# Patient Record
Sex: Female | Born: 1990 | Race: White | Hispanic: Refuse to answer | Smoking: Never smoker
Health system: Southern US, Community
[De-identification: ages and names within clinical notes are randomized; demographics above are authoritative.]

---

## 2018-03-16 ENCOUNTER — Other Ambulatory Visit
Admission: RE | Admit: 2018-03-16 | Discharge: 2018-03-16 | Disposition: A | Discharge status: Home / Self Care | Payer: BLUE CROSS/BLUE SHIELD | Source: Ambulatory Visit | Attending: Student | Admitting: Student

## 2018-03-16 DIAGNOSIS — R197 Diarrhea, unspecified: Secondary | ICD-10-CM | POA: Diagnosis present

## 2018-03-16 LAB — C DIFFICILE QUICK SCREEN W PCR REFLEX
C DIFFICILE (CDIFF) INTERP: NOT DETECTED
C Diff antigen: NEGATIVE
C Diff toxin: NEGATIVE

## 2018-03-16 LAB — GASTROINTESTINAL PANEL BY PCR, STOOL (REPLACES STOOL CULTURE)
Adenovirus F40/41: NOT DETECTED
Astrovirus: NOT DETECTED
Campylobacter species: NOT DETECTED
Cryptosporidium: NOT DETECTED
Cyclospora cayetanensis: NOT DETECTED
Entamoeba histolytica: NOT DETECTED
Enteroaggregative E coli (EAEC): NOT DETECTED
Enteropathogenic E coli (EPEC): NOT DETECTED
Enterotoxigenic E coli (ETEC): NOT DETECTED
Giardia lamblia: NOT DETECTED
Norovirus GI/GII: NOT DETECTED
Plesimonas shigelloides: NOT DETECTED
Rotavirus A: NOT DETECTED
SHIGA LIKE TOXIN PRODUCING E COLI (STEC): NOT DETECTED
Salmonella species: NOT DETECTED
Sapovirus (I, II, IV, and V): NOT DETECTED
Shigella/Enteroinvasive E coli (EIEC): NOT DETECTED
Vibrio cholerae: NOT DETECTED
Vibrio species: NOT DETECTED
Yersinia enterocolitica: NOT DETECTED

## 2018-03-19 LAB — CALPROTECTIN, FECAL: Calprotectin, Fecal: 26 ug/g (ref 0–120)

## 2019-06-21 ENCOUNTER — Other Ambulatory Visit: Payer: Self-pay

## 2019-06-21 ENCOUNTER — Ambulatory Visit (LOCAL_COMMUNITY_HEALTH_CENTER): Payer: Self-pay

## 2019-06-21 ENCOUNTER — Telehealth: Payer: Self-pay | Admitting: Family Medicine

## 2019-06-21 ENCOUNTER — Ambulatory Visit
Admission: RE | Admit: 2019-06-21 | Discharge: 2019-06-21 | Disposition: A | Discharge status: Home / Self Care | Payer: BC Managed Care – PPO | Source: Ambulatory Visit | Attending: Family Medicine | Admitting: Family Medicine

## 2019-06-21 ENCOUNTER — Ambulatory Visit
Admission: RE | Admit: 2019-06-21 | Discharge: 2019-06-21 | Disposition: A | Discharge status: Home / Self Care | Payer: BC Managed Care – PPO | Attending: Family Medicine | Admitting: Family Medicine

## 2019-06-21 VITALS — Wt 113.0 lb

## 2019-06-21 DIAGNOSIS — R7611 Nonspecific reaction to tuberculin skin test without active tuberculosis: Secondary | ICD-10-CM | POA: Insufficient documentation

## 2019-06-21 NOTE — Telephone Encounter (Signed)
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

## 2019-06-21 NOTE — Telephone Encounter (Signed)
TC with patient.  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  PPD read yesterday.  States [place] and thinks she may have had a +PPD in the past.  Appt scheduled for TB EPI and PPDR [name], RN

## 2019-06-21 NOTE — Progress Notes (Signed)
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  Discussed LTBI vs Active TB, tx options and employer letter for work.  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 TB RN will call patient with CXR results and will provide employer letter. Gave literature re: TB and Rifampin. [name], RN

## 2019-06-25 ENCOUNTER — Ambulatory Visit (LOCAL_COMMUNITY_HEALTH_CENTER): Payer: Self-pay

## 2019-06-25 VITALS — Wt 113.0 lb

## 2019-06-25 DIAGNOSIS — R7611 Nonspecific reaction to tuberculin skin test without active tuberculosis: Secondary | ICD-10-CM

## 2019-06-25 DIAGNOSIS — Z227 Latent tuberculosis: Secondary | ICD-10-CM

## 2019-06-25 MED ORDER — RIFAMPIN 300 MG PO CAPS: 600.0000 mg | ORAL_CAPSULE | Freq: Every day | ORAL | 0 refills | Status: AC

## 2019-06-25 NOTE — Progress Notes (Signed)
Rifampin 300mg  #60 dispensed per Dr. S.O. TB RN will f/u with patient in 2-3 weeks to schedule next Rifampin appt. Patient has TB RN contact info in case of questions or concerns.   Employer letter given to patient this am Alvester Morin, RN

## 2019-07-24 ENCOUNTER — Telehealth: Payer: Self-pay

## 2019-09-17 NOTE — Telephone Encounter (Signed)
TB closure letter sent. See scanned [name], RN

## 2021-02-19 IMAGING — CR DG CHEST 1V
1 series · 1 of 1 positions shown · non-contrast
Comparison: None.

CLINICAL DATA: Positive tuberculin skin test

EXAM:
CHEST  1 VIEW

[dg chest 1 view]
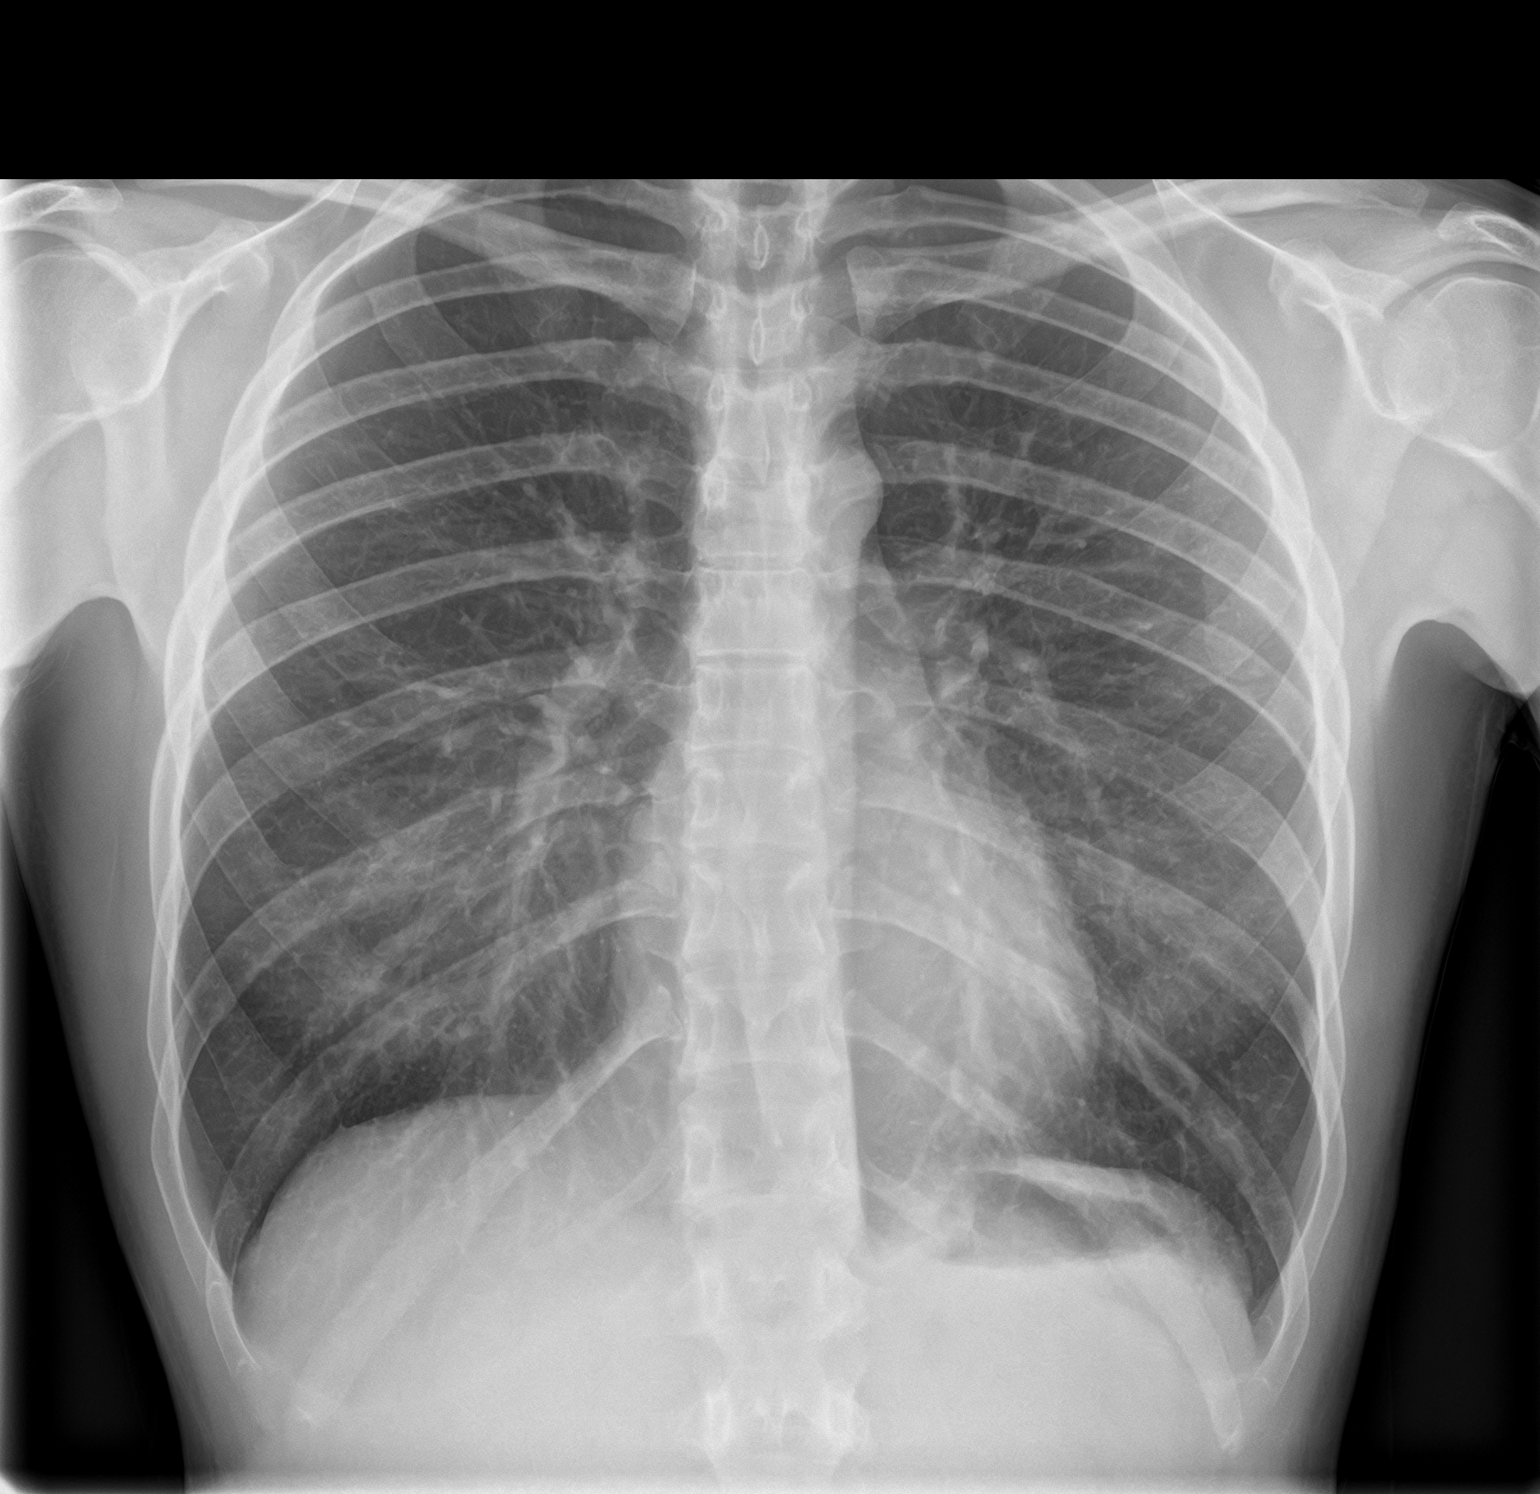

[1 of 1 positions shown; findings below may reference images not displayed]

FINDINGS: Lungs are clear. Heart size and pulmonary vascularity are normal. No
adenopathy. No bone lesions.
IMPRESSION: No abnormality noted.
